# Patient Record
Sex: Female | Born: 2000 | Race: Black or African American | Hispanic: No | Marital: Single
Health system: Midwestern US, Community
[De-identification: ages and names within clinical notes are randomized; demographics above are authoritative.]

## PROBLEM LIST (undated history)

## (undated) DIAGNOSIS — F32A Depression, unspecified: Secondary | ICD-10-CM

## (undated) DIAGNOSIS — J45909 Unspecified asthma, uncomplicated: Secondary | ICD-10-CM

## (undated) DIAGNOSIS — F329 Major depressive disorder, single episode, unspecified: Secondary | ICD-10-CM

## (undated) HISTORY — PX: TONSILLECTOMY: SUR1361

## (undated) NOTE — ED Triage Notes (Signed)
 Formatting of this note might be different from the original. Patient reports last menstrual cycle 15 weeks ago. Reports vaginal bleeding and abdominal pain started yesterday. Reports bleeding as heavy, bright red and clots. Denies at home pregnancy test.  Electronically signed by Glendia Birmingham, RN at 09/09/2019  5:23 PM EDT

## (undated) NOTE — ED Provider Notes (Signed)
 Formatting of this note is different from the original. EMERGENCY DEPARTMENT HISTORY AND PHYSICAL EXAM  Date: 09/09/2019 Patient Name: Tamara Cummings  History of Presenting Illness   Chief Complaint  Patient presents with  ? Vaginal Bleeding   History Provided By: Patient  Chief Complaint: missed period, abd pain and vaginal bleeding  Duration: 15 weeks-LMP, vaginal bleeding x 2 days Timing: acute  Location: pelvic  Quality: crampy, heavy bleeding  Severity:moderate Modifying Factors: none Associated Symptoms: abd cramping   Additional History (Context): Tamara Cummings is a 54 y.o. female with PMH asthma, depression, high risk sexual behavior,  who presents with c/o heavy vaginal bleeding and abd cramping x 2 days after not having a menstrual cycle for approximately 15 weeks. Pt denies taking a home pregnancy test at any point in time during the past 15 weeks. She also has an extensive hx of recurrent pelvic infections to include PID, GC, chalmydia, and tubo-ovarian abscess, all related to high risk sexual activity.  Patient states she is currently only sexually active with her boyfriend who receives regular STD testing every month.  She denies any concerns for STD exposure at this time but does report some intermittent pelvic discomfort and vaginal irritation over the past several weeks.  Denies vaginal discharge at this time.  No other complaints are reported at this time.  PCP: None  Current Outpatient Medications  Medication Sig Dispense Refill  ? metroNIDAZOLE  (FlagyL ) 500 mg tablet Take 1 Tablet by mouth two (2) times a day for 7 days. 14 Tablet 0  ? ferrous sulfate (IRON) 325 mg (65 mg iron) EC tablet Take 1 Tablet by mouth two (2) times a day. 28 Tablet 0  ? ibuprofen  (MOTRIN ) 400 mg tablet Take 1 Tab by mouth every six (6) hours as needed for Pain. Indications: pain 30 Tab 0  ? ARIPiprazole (Abilify) 10 mg tablet Take 1 Tab by mouth daily. 30 Tab 1  ? escitalopram  oxalate  (LEXAPRO ) 10 mg tablet Take 1 Tab by mouth daily. 30 Tab 1  ? ARIPiprazole (Abilify) 10 mg tablet Take 10 mg by mouth daily.    ? escitalopram  oxalate (Lexapro ) 10 mg tablet Take 10 mg by mouth daily.     Past History   Past Medical History: Past Medical History:  Diagnosis Date  ? Asthma   ? Problems related to high-risk sexual behavior 06/06/2018  ? TOA (tubo-ovarian abscess) 06/06/2018   Past Surgical History: Past Surgical History:  Procedure Laterality Date  ? HX TONSILLECTOMY     Family History: No family history on file.  Social History: Social History   Tobacco Use  ? Smoking status: Never Smoker  ? Smokeless tobacco: Never Used  Substance Use Topics  ? Alcohol use: Never  ? Drug use: Yes    Types: Marijuana   Allergies: No Known Allergies  Review of Systems  Review of Systems  Constitutional: Negative.  Negative for chills and fever.  HENT: Negative.  Negative for congestion, ear pain and rhinorrhea.   Eyes: Negative.  Negative for pain and redness.  Respiratory: Negative.  Negative for cough, shortness of breath, wheezing and stridor.   Cardiovascular: Negative.  Negative for chest pain and leg swelling.  Gastrointestinal: Positive for abdominal pain. Negative for constipation, diarrhea, nausea and vomiting.  Genitourinary: Positive for menstrual problem, pelvic pain and vaginal bleeding. Negative for dysuria and frequency.  Musculoskeletal: Negative.  Negative for back pain and neck pain.  Skin: Negative.  Negative for rash and wound.  Neurological: Negative.  Negative for dizziness, seizures, syncope and headaches.  All other systems reviewed and are negative.  All Other Systems Negative Physical Exam   Vitals:   09/09/19 1721  BP: 129/91  Pulse: 87  Resp: 18  Temp: 98.2 F (36.8 C)  SpO2: 100%  Weight: 59 kg  Height: 160 cm   Physical Exam Vitals and nursing note reviewed.  Constitutional:      General: She is not in acute distress.     Appearance: She is well-developed. She is not diaphoretic.  HENT:     Head: Normocephalic and atraumatic.  Eyes:     General: No scleral icterus.       Right eye: No discharge.        Left eye: No discharge.     Conjunctiva/sclera: Conjunctivae normal.  Cardiovascular:     Rate and Rhythm: Normal rate and regular rhythm.     Heart sounds: Normal heart sounds. No murmur heard.  No friction rub. No gallop.   Pulmonary:     Effort: Pulmonary effort is normal. No respiratory distress.     Breath sounds: Normal breath sounds. No stridor. No wheezing or rales.  Abdominal:     General: Bowel sounds are normal. There is no distension.     Palpations: Abdomen is soft.     Tenderness: There is abdominal tenderness. There is no guarding.     Comments: Diffuse lower abd TTP noted on exam, no guarding noted, no evidence of acute abdomen   Genitourinary:    Comments: Completed self swabs at bedside  Musculoskeletal:        General: Normal range of motion.     Cervical back: Normal range of motion and neck supple.  Skin:    General: Skin is warm and dry.     Findings: No erythema or rash.  Neurological:     Mental Status: She is alert and oriented to person, place, and time.     Coordination: Coordination normal.     Comments: Gait is steady and patient exhibits no evidence of ataxia. Patient is able to ambulate without difficulty. No focal neurological deficit noted. No facial droop, slurred speech, or evidence of altered mentation noted on exam.    Psychiatric:        Behavior: Behavior normal.        Thought Content: Thought content normal.     Diagnostic Study Results   Labs -  Recent Results (from the past 12 hour(s))  HCG URINE, QL   Collection Time: 09/09/19  5:24 PM  Result Value Ref Range   HCG urine, QL Negative NEG    URINALYSIS W/ RFLX MICROSCOPIC   Collection Time: 09/09/19  5:24 PM  Result Value Ref Range   Color DARK YELLOW     Appearance CLOUDY     Specific  gravity >1.030 (H) 1.005 - 1.030   pH (UA) 5.5 5.0 - 8.0     Protein 100 (A) NEG mg/dL   Glucose Negative NEG mg/dL   Ketone TRACE (A) NEG mg/dL   Bilirubin Negative NEG     Blood LARGE (A) NEG     Urobilinogen 1.0 0.2 - 1.0 EU/dL   Nitrites Negative NEG     Leukocyte Esterase Negative NEG    URINE MICROSCOPIC ONLY   Collection Time: 09/09/19  5:24 PM  Result Value Ref Range   WBC 0 to 3 0 - 4 /hpf   RBC TOO NUMEROUS TO COUNT 0 - 5 /  hpf   Epithelial cells 2+ 0 - 5 /lpf   Bacteria 1+ (A) NEG /hpf   Mucus 1+ (A) NEG /lpf  METABOLIC PANEL, COMPREHENSIVE   Collection Time: 09/09/19  5:45 PM  Result Value Ref Range   Sodium 138 136 - 145 mmol/L   Potassium 4.8 3.5 - 5.5 mmol/L   Chloride 109 100 - 111 mmol/L   CO2 25 21 - 32 mmol/L   Anion gap 4 3.0 - 18 mmol/L   Glucose 83 74 - 99 mg/dL   BUN 13 7.0 - 18 MG/DL   Creatinine 9.36 0.6 - 1.3 MG/DL   BUN/Creatinine ratio 21 (H) 12 - 20     GFR est AA Cannot be calculated >60 ml/min/1.79m2   GFR est non-AA Cannot be calculated >60 ml/min/1.29m2   Calcium 8.6 8.5 - 10.1 MG/DL   Bilirubin, total 0.3 0.2 - 1.0 MG/DL   ALT (SGPT) 21 13 - 56 U/L   AST (SGOT) 64 (H) 10 - 38 U/L   Alk. phosphatase 72 45 - 117 U/L   Protein, total 8.5 (H) 6.4 - 8.2 g/dL   Albumin 3.9 3.4 - 5.0 g/dL   Globulin 4.6 (H) 2.0 - 4.0 g/dL   A-G Ratio 0.8 0.8 - 1.7    BETA HCG, QT   Collection Time: 09/09/19  5:45 PM  Result Value Ref Range   Beta HCG, QT <1 0 - 10 MIU/ML  WET PREP   Collection Time: 09/09/19  7:07 PM   Specimen: Vagina  Result Value Ref Range   Special Requests: NO SPECIAL REQUESTS     Wet prep FEW CLUE CELLS PRESENT   Wet prep NO YEAST SEEN     Wet prep NO TRICHOMONAS SEEN    CBC W/O DIFF   Collection Time: 09/09/19  7:34 PM  Result Value Ref Range   WBC 5.6 4.6 - 13.2 K/uL   RBC 3.98 (L) 4.00 - 5.20 M/uL   HGB 8.6 (L) 11.5 - 15.0 g/dL   HCT 71.3 (L) 64.9 - 54.9 %   MCV 71.9 (L) 77.0 - 95.0 FL   MCH 21.6 (L) 25.0 - 33.0 PG    MCHC 30.1 (L) 31.0 - 37.0 g/dL   RDW 80.9 (H) 88.3 - 85.4 %   PLATELET 345 135 - 420 K/uL   MPV 10.2 9.2 - 11.8 FL  DIFFERENTIAL, AUTO   Collection Time: 09/09/19  7:34 PM  Result Value Ref Range   NEUTROPHILS 47 40 - 73 %   LYMPHOCYTES 38 21 - 52 %   MONOCYTES 12 (H) 3 - 10 %   EOSINOPHILS 2 0 - 5 %   BASOPHILS 1 0 - 2 %   ABS. NEUTROPHILS 2.6 1.8 - 8.0 K/UL   ABS. LYMPHOCYTES 2.1 0.9 - 3.6 K/UL   ABS. MONOCYTES 0.7 0.05 - 1.2 K/UL   ABS. EOSINOPHILS 0.1 0.0 - 0.4 K/UL   ABS. BASOPHILS 0.0 0.0 - 0.1 K/UL   DF AUTOMATED     Radiologic Studies -  US  TRANSVAGINAL W DOPPLER  Final Result  1.  No acute pathology appreciated in the uterus or adnexa    CT Results  (Last 48 hours)   None    CXR Results  (Last 48 hours)   None    Medical Decision Making  I am the first provider for this patient.  I reviewed the vital signs, available nursing notes, past medical history, past surgical history, family history and social history.  Vital  Signs-Reviewed the patient's vital signs.  Records Reviewed:  April J Harvill, PA-C   Procedures: Procedures  Provider Notes (Medical Decision Making): Impression: DUB, pelvic pain  IV inserted   UA large blood, trace ketones, 100 protein, 1+ mucus, 1+ bacteria, negative leukocyte esterase, hcg negative  Beta hcg < 1 CMP unremarkable, CBC: hgb 8.6, hct 28.6, normal WBC, wet prep shows BV US  negative for torsion or TOA, no acute process noted  Discussed these results with the pt. Will plan to d.c with flagyl  and ferrous sulfate, pcp and OBGYN follow-up recommended. Pt agrees. April J Harvill, PA-C   MED RECONCILIATION: No current facility-administered medications for this encounter.   Current Outpatient Medications  Medication Sig  ? metroNIDAZOLE  (FlagyL ) 500 mg tablet Take 1 Tablet by mouth two (2) times a day for 7 days.  ? ferrous sulfate (IRON) 325 mg (65 mg iron) EC tablet Take 1 Tablet by mouth two (2) times a day.  ?  ibuprofen  (MOTRIN ) 400 mg tablet Take 1 Tab by mouth every six (6) hours as needed for Pain. Indications: pain  ? ARIPiprazole (Abilify) 10 mg tablet Take 1 Tab by mouth daily.  ? escitalopram  oxalate (LEXAPRO ) 10 mg tablet Take 1 Tab by mouth daily.  ? ARIPiprazole (Abilify) 10 mg tablet Take 10 mg by mouth daily.  ? escitalopram  oxalate (Lexapro ) 10 mg tablet Take 10 mg by mouth daily.   Disposition: D/c  DISCHARGE NOTE:  Patient is stable for discharge at this time. I have discussed all the findings from today's work up with the patient, including lab results and imaging. I have answered all questions. Rx for flagyl  and ferrous sulfate given. Rest and close follow-up with the PCP recommended this week. Return to the ED immediately for any new or worsening symptoms.  April JINNY Golds, PA-C   Follow-up Information    Follow up With Specialties Details Why Contact Info   [facility], [facility] Obstetrics & Gynecology In 1 week  [address] [place]  [address] [phone]   [facility] EMERGENCY DEPT Emergency Medicine  As needed, If symptoms worsen [address] [place]  [id number] [phone]    Current Discharge Medication List   START taking these medications   Details  metroNIDAZOLE  (FlagyL ) 500 mg tablet Take 1 Tablet by mouth two (2) times a day for 7 days. Qty: 14 Tablet, Refills: 0 Start date: 09/09/2019, End date: 09/16/2019   ferrous sulfate (IRON) 325 mg (65 mg iron) EC tablet Take 1 Tablet by mouth two (2) times a day. Qty: 28 Tablet, Refills: 0 Start date: 09/09/2019     Diagnosis   Clinical Impression:  1. Pelvic pain   2. DUB (dysfunctional uterine bleeding)   3. Abnormal menses   4. Anemia, unspecified type   5. BV (bacterial vaginosis)     Electronically signed by Orlena Toribio FERNS, MD at 09/15/2019 10:14 AM EDT  Associated attestation - Orlena Toribio FERNS, MD - 09/15/2019 10:14 AM EDT Formatting of this note might be  different from the original. I was personally available for consultation in the emergency department.  I have reviewed the chart prior to the patient being discharged and agree with the documentation recorded by the [facility], including the assessment, treatment plan, and disposition. Toribio FERNS Orlena, MD

## (undated) NOTE — ED Notes (Signed)
 Formatting of this note might be different from the original. 10:51 PM 09/09/19  Discharge instructions given to patient (name) with verbalization of understanding. Patient accompanied by self.  Patient discharged with the following prescriptions Flagyl . Patient discharged to home (destination).   Josette EMERSON Manifold, RN  Electronically signed by Manifold Josette HERO., RN at 09/09/2019 10:52 PM EDT

## (undated) NOTE — Progress Notes (Signed)
 Formatting of this note might be different from the original. Not treated empirically.  Needs a call back Electronically signed by Cloretta Onalee LABOR, MD at 09/13/2019  4:21 AM EDT

---
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
## 2021-04-15 ENCOUNTER — Inpatient Hospital Stay
Admission: EM | Admit: 2021-04-15 | Discharge: 2021-04-18 | Disposition: A | Discharge status: Home / Self Care | Payer: MEDICAID | Admitting: Psychiatry

## 2021-04-15 DIAGNOSIS — T1491XA Suicide attempt, initial encounter: Secondary | ICD-10-CM

## 2021-04-15 DIAGNOSIS — F333 Major depressive disorder, recurrent, severe with psychotic symptoms: Secondary | ICD-10-CM

## 2021-04-15 LAB — COMPREHENSIVE METABOLIC PANEL
ALT: 19 U/L (ref 13–56)
AST: 25 U/L (ref 10–38)
Albumin/Globulin Ratio: 1 (ref 0.8–1.7)
Albumin: 4.1 g/dL (ref 3.4–5.0)
Alk Phosphatase: 67 U/L (ref 45–117)
Anion Gap: 5 mmol/L (ref 3.0–18)
BUN: 11 MG/DL (ref 7.0–18)
Bun/Cre Ratio: 16 (ref 12–20)
CO2: 25 mmol/L (ref 21–32)
Calcium: 9.4 MG/DL (ref 8.5–10.1)
Chloride: 106 mmol/L (ref 100–111)
Creatinine: 0.68 MG/DL (ref 0.6–1.3)
Est, Glom Filt Rate: >60 mL/min/{1.73_m2} (ref >60)
Globulin: 4.3 g/dL — ABNORMAL HIGH (ref 2.0–4.0)
Glucose: 70 mg/dL — ABNORMAL LOW (ref 74–99)
Potassium: 3.7 mmol/L (ref 3.5–5.5)
Sodium: 136 mmol/L (ref 136–145)
Total Bilirubin: 0.6 MG/DL (ref 0.2–1.0)
Total Protein: 8.4 g/dL — ABNORMAL HIGH (ref 6.4–8.2)

## 2021-04-15 LAB — URINE DRUG SCREEN
Amphetamine, Urine: NEGATIVE
Barbiturates, Urine: NEGATIVE
Benzodiazepines, Urine: NEGATIVE
Cocaine, Urine: NEGATIVE
Methadone, Urine: NEGATIVE
Opiates, Urine: NEGATIVE
PCP, Urine: NEGATIVE
THC, TH-Cannabinol, Urine: POSITIVE — AB

## 2021-04-15 LAB — CBC WITH AUTO DIFFERENTIAL
Absolute Immature Granulocyte: 0 10*3/uL (ref 0.00–0.04)
Basophils %: 1 % (ref 0–2)
Basophils Absolute: 0 10*3/uL (ref 0.0–0.1)
Eosinophils %: 8 % — ABNORMAL HIGH (ref 0–5)
Eosinophils Absolute: 0.3 10*3/uL (ref 0.0–0.4)
Hematocrit: 34.2 % — ABNORMAL LOW (ref 35.0–45.0)
Hemoglobin: 11.2 g/dL — ABNORMAL LOW (ref 12.0–16.0)
Immature Granulocytes: 0 % (ref 0.0–0.5)
Lymphocytes %: 39 % (ref 21–52)
Lymphocytes Absolute: 1.5 10*3/uL (ref 0.9–3.6)
MCH: 27.1 PG (ref 24.0–34.0)
MCHC: 32.7 g/dL (ref 31.0–37.0)
MCV: 82.8 FL (ref 78.0–100.0)
MPV: 12 FL — ABNORMAL HIGH (ref 9.2–11.8)
Monocytes %: 11 % — ABNORMAL HIGH (ref 3–10)
Monocytes Absolute: 0.4 10*3/uL (ref 0.05–1.2)
Neutrophils %: 41 % (ref 40–73)
Neutrophils Absolute: 1.6 10*3/uL — ABNORMAL LOW (ref 1.8–8.0)
Nucleated RBCs: 0 PER 100 WBC
Platelets: 246 10*3/uL (ref 135–420)
RBC: 4.13 M/uL — ABNORMAL LOW (ref 4.20–5.30)
RDW: 16.9 % — ABNORMAL HIGH (ref 11.6–14.5)
WBC: 3.9 10*3/uL — ABNORMAL LOW (ref 4.6–13.2)
nRBC: 0 10*3/uL (ref 0.00–0.01)

## 2021-04-15 LAB — ACETAMINOPHEN LEVEL: Acetaminophen Level: <2 ug/mL — ABNORMAL LOW (ref 10.0–30.0)

## 2021-04-15 LAB — ETHANOL: Ethanol Lvl: <3 MG/DL (ref 0–3)

## 2021-04-15 LAB — COVID-19 & INFLUENZA COMBO
Rapid Influenza A By PCR: NOT DETECTED
Rapid Influenza B By PCR: NOT DETECTED
SARS-CoV-2, PCR: NOT DETECTED

## 2021-04-15 LAB — SALICYLATE LEVEL
Salicylate, Serum: 1.9 MG/DL — ABNORMAL LOW (ref 2.8–20.0)
Salicylate, Serum: <1.7 MG/DL — ABNORMAL LOW (ref 2.8–20.0)

## 2021-04-15 LAB — HCG, SERUM, QUALITATIVE: HCG, Ql.: NEGATIVE

## 2021-04-15 MED ORDER — ONDANSETRON 8 MG PO TBDP
8 MG | ORAL | Status: AC
Start: 2021-04-15 — End: 2021-04-15
  Administered 2021-04-15: 17:00:00 8 mg via ORAL

## 2021-04-15 MED ORDER — LIDOCAINE VISCOUS HCL 2 % MT SOLN
2 % | Freq: Once | OROMUCOSAL | Status: AC
Start: 2021-04-15 — End: 2021-04-15
  Administered 2021-04-15: 17:00:00 40 mL via ORAL

## 2021-04-15 MED FILL — ONDANSETRON 8 MG PO TBDP: 8 MG | ORAL | Qty: 1

## 2021-04-15 MED FILL — MAG-AL PLUS 200-200-20 MG/5ML PO LIQD: 200-200-20 MG/5ML | ORAL | Qty: 30

## 2021-04-15 NOTE — ED Notes (Signed)
SBAR Report called to [place], RN at ext [phone]. All questions answered. Pt to be transported to IP unit and is aware.      Edmonia James, RN  04/15/21 [phone]

## 2021-04-15 NOTE — ED Notes (Addendum)
Pt checked and changed to a paper gown. Security checked done,pt's belongings placed on locker 3A.     Gwenevere Abbot, RN  04/15/21 [address] [place], RN  04/15/21 1319

## 2021-04-15 NOTE — ED Notes (Signed)
Spoke with Marcelino Duster from poison control to update information regarding pt.      Edmonia James, RN  04/15/21 2101

## 2021-04-15 NOTE — ED Notes (Addendum)
Noted pt's BS in labs 70, pt was given lunch tray.     Gwenevere Abbot, RN  04/15/21 1505       Ladora Daniel [place], RN  04/15/21 1616

## 2021-04-15 NOTE — ED Provider Notes (Signed)
[facility] EMERGENCY DEPT  EMERGENCY DEPARTMENT ENCOUNTER      Pt Name: Jillian Larsen  MRN: [id number]  Birthdate October 27, 2000  Date of evaluation: 04/15/2021  Provider: Arvella Merles, PA    CHIEF COMPLAINT       Chief Complaint   Patient presents with    Suicide Attempt         HISTORY OF PRESENT ILLNESS   (Location/Symptom, Timing/Onset, Context/Setting, Quality, Duration, Modifying Factors, Severity)  Note limiting factors.   Jillian Larsen is a 21 y.o. female who presents to the emergency department complaint of suicide attempt by drinking brake power steering fluid and some sort of car cleaning fluid today she sprayed that in her mouth.  Her boyfriend was resting away from her.  She said what she swallowed she also spit right back up.  Does not think she drink more than a mouthful.  Feels depressed.  She is to take medications, like Abilify, hydroxyzine, and Lexapro when she was in [place] but now that she is up here since moving from [place] she does not have [place] and is no longer eligible for meds.  Denies possibility of pregnancy.  Her throat is a little sore and a little nauseous but denies any difficulty swallowing or abdominal pain.  Denies tobacco alcohol or illicits.    HPI    Nursing Notes were reviewed.    REVIEW OF SYSTEMS    (2-9 systems for level 4, 10 or more for level 5)     Review of Systems   Constitutional: Negative.    HENT:  Positive for sore throat. Negative for trouble swallowing.    Eyes: Negative.    Respiratory: Negative.     Cardiovascular: Negative.    Gastrointestinal:  Positive for nausea. Negative for abdominal pain.   Endocrine: Negative.    Genitourinary: Negative.    Musculoskeletal: Negative.    Skin: Negative.    Allergic/Immunologic: Negative.    Neurological: Negative.    Hematological: Negative.    Psychiatric/Behavioral:  Positive for dysphoric mood and suicidal ideas.      Except as noted above the remainder of the review of systems was reviewed and  negative.       PAST MEDICAL HISTORY     Past Medical History:   Diagnosis Date    Asthma     Problems related to high-risk sexual behavior 06/06/2018    TOA (tubo-ovarian abscess) 06/06/2018         SURGICAL HISTORY       Past Surgical History:   Procedure Laterality Date    TONSILLECTOMY           CURRENT MEDICATIONS       Previous Medications    ARIPIPRAZOLE (ABILIFY) 10 MG TABLET    Take 10 mg by mouth daily    ESCITALOPRAM (LEXAPRO) 10 MG TABLET    Take 10 mg by mouth daily    FERROUS SULFATE (FE TABS 325) 325 (65 FE) MG EC TABLET    Take 325 mg by mouth 2 times daily    IBUPROFEN (ADVIL;MOTRIN) 400 MG TABLET    Take 400 mg by mouth every 6 hours as needed       ALLERGIES     Peanut-containing drug products    FAMILY HISTORY     No family history on file.       SOCIAL HISTORY       Social History     Socioeconomic History    Marital status: Single  Tobacco Use    Smoking status: Never    Smokeless tobacco: Never   Substance and Sexual Activity    Alcohol use: Never    Drug use: Yes     Types: Marijuana (Weed)       SCREENINGS         Glasgow Coma Scale  Eye Opening: Spontaneous  Best Verbal Response: Oriented  Best Motor Response: Obeys commands  Glasgow Coma Scale Score: 15                     CIWA Assessment  BP: 114/67  Heart Rate: 81                 PHYSICAL EXAM    (up to 7 for level 4, 8 or more for level 5)     ED Triage Vitals [04/15/21 1232]   BP Temp Temp Source Heart Rate Resp SpO2 Height Weight   114/67 98.4 F (36.9 C) Oral 81 16 95 % 5\' 3"  (1.6 m) 134 lb (60.8 kg)       Physical Exam  Constitutional:       General: She is not in acute distress.     Appearance: Normal appearance. She is normal weight. She is not toxic-appearing.   HENT:      Head: Normocephalic.      Nose: Nose normal.      Mouth/Throat:      Mouth: Mucous membranes are moist.   Eyes:      Extraocular Movements: Extraocular movements intact.   Cardiovascular:      Rate and Rhythm: Normal rate and regular rhythm.      Pulses: Normal  pulses.      Heart sounds: Normal heart sounds.   Pulmonary:      Effort: Pulmonary effort is normal.      Breath sounds: Normal breath sounds.   Abdominal:      General: Abdomen is flat.      Tenderness: There is no abdominal tenderness.   Musculoskeletal:         General: No deformity.      Cervical back: Normal range of motion and neck supple. No rigidity.   Skin:     General: Skin is warm.   Neurological:      Mental Status: She is alert and oriented to person, place, and time.   Psychiatric:         Mood and Affect: Mood normal.         Behavior: Behavior normal.         Thought Content: Thought content normal.         Judgment: Judgment normal.       DIAGNOSTIC RESULTS     EKG: All EKG's are interpreted by the Emergency Department Physician who either signs or Co-signs this chart in the absence of a cardiologist.        RADIOLOGY:   Non-plain film images such as CT, Ultrasound and MRI are read by the radiologist. Plain radiographic images are visualized and preliminarily interpreted by the emergency physician with the below findings:        Interpretation per the Radiologist below, if available at the time of this note:    No orders to display         ED BEDSIDE ULTRASOUND:   Performed by ED Physician - none    LABS:  Labs Reviewed   COVID-19 & INFLUENZA COMBO   CBC WITH AUTO DIFFERENTIAL  COMPREHENSIVE METABOLIC PANEL   ETHANOL   HCG, SERUM, QUALITATIVE   URINE DRUG SCREEN       All other labs were within normal range or not returned as of this dictation.    EMERGENCY DEPARTMENT COURSE and DIFFERENTIAL DIAGNOSIS/MDM:   Vitals:    Vitals:    04/15/21 1232   BP: 114/67   Pulse: 81   Resp: 16   Temp: 98.4 F (36.9 C)   TempSrc: Oral   SpO2: 95%   Weight: 134 lb (60.8 kg)   Height: 5\' 3"  (1.6 m)           Medical Decision Making  Amount and/or Complexity of Data Reviewed  Labs: ordered.  ECG/medicine tests: ordered.    Risk  Prescription drug management.  Decision regarding hospitalization.      Suicide attempt  by ingestion.  No longer has access to psychiatric care.  Medically stable evaluated by crisis excepted here.      REASSESSMENT          CRITICAL CARE TIME   Total Critical Care time was 35 minutes, excluding separately reportable procedures.  There was a high probability of clinically significant/life threatening deterioration in the patient's condition which required my urgent intervention.  Critical care time exclusive of any billable procedures.    CONSULTS:  None    PROCEDURES:  Unless otherwise noted below, none     Procedures      FINAL IMPRESSION    No diagnosis found.      DISPOSITION/PLAN   DISPOSITION        PATIENT REFERRED TO:  No follow-up provider specified.    DISCHARGE MEDICATIONS:  New Prescriptions    No medications on file     Controlled Substances Monitoring:     No flowsheet data found.    (Please note that portions of this note were completed with a voice recognition program.  Efforts were made to edit the dictations but occasionally words are mis-transcribed.)    Arvella MerlesJana Gaylo, PA (electronically signed)  Attending Emergency Physician           Karen KitchensJana L Estephani Popper, PA  04/15/21 [phone]

## 2021-04-15 NOTE — ED Notes (Signed)
Called poison control, per Marcelino Duster she recommends EKG, ASA and Tylenol levels.     Gwenevere Abbot, RN  04/15/21 1358

## 2021-04-15 NOTE — Progress Notes (Signed)
Pt. arrived from ED alert and oriented x 4 escorted by Security and ED staff via wheelchair. Pt. Is admitted for suicidal attempt, she tried to drink a brake fluid and car cleaning fluid under voluntary admission. She has a previous Psychiatry in patient admission in [place]. She is currently not taking any medications .She is depressed, flat affect with sad mood but pleasant and cooperative to admission process.   RN WILL INITIATE, DEVELOP, IMPLEMENT, REVIEW OR REVISE TREATMENT PLAN.

## 2021-04-15 NOTE — ED Notes (Signed)
Report given to Taheesha,RN. All questions answered.     Courtlyn Aki [place] Crandall [place], RN  04/15/21 1917

## 2021-04-15 NOTE — Other (Signed)
[facility] Note: Responded to Dr. Barbaraann Cao consult for mental health problem. Crisis counselor to assess as soon as possible pending, an attending being assigned, medical clearance, UDS results, and a clinically sober EtOH level < 150 mg/dl.

## 2021-04-15 NOTE — ED Triage Notes (Signed)
Pt arrived via EMS c/o SI per report pt attempted to drink power steering liquid and armol oil, unsure amount per pt she vomited right away wioth yellow liquid. Pt c/o abdominal pain on ED arrival. Hx of Depression.

## 2021-04-15 NOTE — Other (Signed)
Crisis Note: Patient has been accepted for inpatient treatment at [facility] by Dr. Freddy Finner. Crisis gave report to RN Joletta. Patient will go into room Room 124-1. Discussed with NSS Beth about bed assignment. Charge nurse and ED physician made aware.
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
## 2021-04-15 NOTE — ED Notes (Signed)
Report given to Taheesha,RN. All questions answered.     Eve Dohse, RN  04/15/21 1915

## 2021-04-15 NOTE — ED Notes (Signed)
Report received from [place], RN     [employer], [place]  04/15/21 2049

## 2021-04-16 LAB — EKG 12-LEAD
Atrial Rate: 73 {beats}/min
Diagnosis: NORMAL
P Axis: 71 degrees
P-R Interval: 144 ms
Q-T Interval: 378 ms
QRS Duration: 70 ms
QTc Calculation (Bazett): 416 ms
R Axis: 66 degrees
T Axis: 59 degrees
Ventricular Rate: 73 {beats}/min

## 2021-04-16 MED ORDER — TRAZODONE HCL 50 MG PO TABS
50 MG | Freq: Every evening | ORAL | Status: DC | PRN
Start: 2021-04-16 — End: 2021-04-16
  Administered 2021-04-16: 03:00:00 50 mg via ORAL

## 2021-04-16 MED ORDER — HYDROXYZINE PAMOATE 50 MG PO CAPS
50 MG | Freq: Every evening | ORAL | Status: DC
Start: 2021-04-16 — End: 2021-04-18
  Administered 2021-04-18: 50 mg via ORAL

## 2021-04-16 MED ORDER — ALBUTEROL SULFATE HFA 108 (90 BASE) MCG/ACT IN AERS
10890 (90 Base) MCG/ACT | Freq: Four times a day (QID) | RESPIRATORY_TRACT | Status: DC | PRN
Start: 2021-04-16 — End: 2021-04-18

## 2021-04-16 MED ORDER — ACETAMINOPHEN 325 MG PO TABS
325 | ORAL | Status: DC | PRN
Start: 2021-04-16 — End: 2021-04-18

## 2021-04-16 MED ORDER — FERROUS SULFATE 325 (65 FE) MG PO TABS
32565 (65 Fe) MG | Freq: Every day | ORAL | Status: DC
Start: 2021-04-16 — End: 2021-04-17

## 2021-04-16 MED ORDER — ESCITALOPRAM OXALATE 10 MG PO TABS
10 MG | Freq: Every day | ORAL | Status: DC
Start: 2021-04-16 — End: 2021-04-18
  Administered 2021-04-16 – 2021-04-18 (×3): 10 mg via ORAL

## 2021-04-16 MED ORDER — POLYETHYLENE GLYCOL 3350 17 G PO PACK
17 g | Freq: Every day | ORAL | Status: DC | PRN
Start: 2021-04-16 — End: 2021-04-18

## 2021-04-16 MED ORDER — NICOTINE POLACRILEX 4 MG MT LOZG
4 MG | OROMUCOSAL | Status: DC | PRN
Start: 2021-04-16 — End: 2021-04-18

## 2021-04-16 MED ORDER — EPINEPHRINE (ANAPHYLAXIS) 1 MG/ML IJ SOLN
1 MG/ML | INTRAMUSCULAR | Status: DC | PRN
Start: 2021-04-16 — End: 2021-04-18

## 2021-04-16 MED ORDER — ARIPIPRAZOLE 5 MG PO TABS
5 MG | Freq: Every day | ORAL | Status: DC
Start: 2021-04-16 — End: 2021-04-18
  Administered 2021-04-16 – 2021-04-18 (×3): 5 mg via ORAL

## 2021-04-16 MED ORDER — HYDROXYZINE HCL 50 MG PO TABS
50 MG | Freq: Three times a day (TID) | ORAL | Status: DC | PRN
Start: 2021-04-16 — End: 2021-04-18
  Administered 2021-04-16: 03:00:00 50 mg via ORAL

## 2021-04-16 MED FILL — ARIPIPRAZOLE 5 MG PO TABS: 5 MG | ORAL | Qty: 1

## 2021-04-16 MED FILL — HYDROXYZINE HCL 50 MG PO TABS: 50 MG | ORAL | Qty: 1

## 2021-04-16 MED FILL — TRAZODONE HCL 50 MG PO TABS: 50 MG | ORAL | Qty: 1

## 2021-04-16 MED FILL — ESCITALOPRAM OXALATE 10 MG PO TABS: 10 MG | ORAL | Qty: 1

## 2021-04-16 MED FILL — ALBUTEROL SULFATE HFA 108 (90 BASE) MCG/ACT IN AERS: 108 (90 Base) MCG/ACT | RESPIRATORY_TRACT | Qty: 1.2

## 2021-04-16 NOTE — Progress Notes (Signed)
Received patient In bed no acute distress, no  unsafe behaviors noted at this time. Patient was seen by [facility] medicine this morning.  Staff to continue to monitor for location, safety, and comfort.
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
## 2021-04-16 NOTE — Progress Notes (Signed)
Brief Progress Note    In addition to inhaler/epi pen started qd PO iron for iron deficiency anemia. Would recommend continuation on discharge. Will be available as needed.     Cathie Hoops, MD PGY-2  [facility]  April 16, 2021 1:59 PM

## 2021-04-16 NOTE — Behavioral Health Treatment Team (Signed)
Pt appeared to have slept for 6.75 hours thus far.Will continue to monitor for safety and changes in behavior.

## 2021-04-16 NOTE — Plan of Care (Signed)
Problem: Safety - Adult  Goal: Free from fall injury  Flowsheets (Taken 04/16/2021 0219)  Free From Fall Injury:   Instruct family/caregiver on patient safety   Based on caregiver fall risk screen, instruct family/caregiver to ask for assistance with transferring infant if caregiver noted to have fall risk factors

## 2021-04-16 NOTE — Progress Notes (Signed)
Chaplain conducted a Spiritual Group for Jillian Larsen, who is a 20 y.o.,female. Patient's Primary Language is: [place].   According to the patient's EMR Religious Affiliation is: [place].       The Chaplain provided the following Interventions:  Spiritual Group a relationship of care and support with  9 to 11 members. The group talked about spirituality. What does that mean to you? Spirituality is recognizing and celebrating that we are all connected to each other by a power greater than all of [place].      Chaplain Leron Croak   Spiritual Care   [phone]

## 2021-04-16 NOTE — Behavioral Health Treatment Team (Signed)
M.H.T. Note: The above pt has moved from room 124-1 to room 126-3 a this time with all her belongings has been moved at this time.

## 2021-04-16 NOTE — Behavioral Health Treatment Team (Signed)
M.H.T. Note: The above pt refused group despite much encouragement.

## 2021-04-16 NOTE — Plan of Care (Signed)
Problem: Safety - Adult  Goal: Free from fall injury  04/16/2021 0843 by Louanna Raw, RN  Outcome: Progressing  04/16/2021 0219 by Randa Lynn, RN  Flowsheets (Taken 04/16/2021 0219)  Free From Fall Injury:   Instruct family/caregiver on patient safety   Based on caregiver fall risk screen, instruct family/caregiver to ask for assistance with transferring infant if caregiver noted to have fall risk factors   Patient alert and oriented, in no acute distress at this time. Patient was in room resting, woke patient up prior to breakfast being served so she could be seen by [facility] medicine. Patient denies auditory hallucinations, suicidal ideations, harm to self or others when asked.  Patient is in her room resting quietly. Will continue to monitor for location, safety and comfort.
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
## 2021-04-16 NOTE — Progress Notes (Signed)
Patient alert and oriented c/o feeling sick, nauseous, vomiting. Patient blood pressure was checked 123/87 pulse 58. Doctor on call was called and informed. No new orders given. Will continue to monitor for safety, comfort and location.
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
## 2021-04-17 MED FILL — FERROUS SULFATE 325 (65 FE) MG PO TABS: 325 (65 Fe) MG | ORAL | Qty: 1

## 2021-04-17 MED FILL — ESCITALOPRAM OXALATE 10 MG PO TABS: 10 MG | ORAL | Qty: 1

## 2021-04-17 MED FILL — HYDROXYZINE PAMOATE 50 MG PO CAPS: 50 MG | ORAL | Qty: 1

## 2021-04-17 MED FILL — ARIPIPRAZOLE 5 MG PO TABS: 5 MG | ORAL | Qty: 1

## 2021-04-17 NOTE — Progress Notes (Signed)
GROUP THERAPY PROGRESS NOTE      Jillian Larsen was present for medication group.    GROUP TIME: 15 minutes, Thursday 2pm    PERSONAL GOAL FOR PARTICIPATION: To be present for group, participate in discussion, and answer patient-directed questions.    GOAL ORIENTATION: Personal    THERAPEUTIC INTERVENTIONS REVIEWED AND DISCUSSED: The following topics were presented: Therapeutic Interventions: storage of medications, how to remember to refill medications and keep up with doctor appointments, relapse prevention, keeping a record of all medication including prescription and non-prescription drugs, and who to contact with medication questions. Patients were given time to ask questions regarding their current therapy.    IMPRESSION OF PARTICIPATION:    Patient attended session and was attentive and engaged.    Ivor Reining, RPH

## 2021-04-17 NOTE — Behavioral Health Treatment Team (Signed)
Patient appeared to have slept 7 hours.

## 2021-04-17 NOTE — Plan of Care (Addendum)
Problem: Safety - Adult  Goal: Free from fall injury  Outcome: Progressing     Problem: Self Harm/Suicidality  Goal: Will have no self-injury during hospital stay  Outcome: Progressing   Pt is alert and oriented x 4 ,denies si/hi,hallucinations pain and anxiety and depression. Pt told nurse earlier in shift ,"See how I'm smiling" Called for nurse at 2100 with co of vomiting . Nurse went to patient and offered ice chips. She stated she vomited yellow clear vomitus, reminded pt to let nurse see it and to get some rest , no vomiting since , pt sleeping.no meds given due to nausea.
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
## 2021-04-17 NOTE — Progress Notes (Signed)
Behavioral Health Progress Note    Admit Date: 04/15/2021  Hospital day 2    Vitals : No data found.  Labs:  No results found for this or any previous visit (from the past 24 hour(s)).  Meds:   Current Facility-Administered Medications   Medication Dose Route Frequency    albuterol sulfate HFA (PROVENTIL;VENTOLIN;PROAIR) 108 (90 Base) MCG/ACT inhaler 2 puff  2 puff Inhalation Q6H PRN    EPINEPHrine 1 MG/ML injection 0.3 mg  0.3 mg IntraMUSCular PRN    ferrous sulfate (IRON 325) tablet 325 mg  325 mg Oral Daily    hydrOXYzine pamoate (VISTARIL) capsule 50 mg  50 mg Oral Nightly    escitalopram (LEXAPRO) tablet 10 mg  10 mg Oral Daily    ARIPiprazole (ABILIFY) tablet 5 mg  5 mg Oral Daily    acetaminophen (TYLENOL) tablet 650 mg  650 mg Oral Q4H PRN    polyethylene glycol (GLYCOLAX) packet 17 g  17 g Oral Daily PRN    hydrOXYzine HCl (ATARAX) tablet 50 mg  50 mg Oral TID PRN    nicotine polacrilex (COMMIT) lozenge 4 mg  4 mg Oral Q2H PRN      Hospital Problems: Principal Problem:    Major depressive disorder, recurrent, severe with psychotic features (HCC)  Active Problems:    Borderline personality disorder (HCC)    Nicotine use disorder  Resolved Problems:    * No resolved hospital problems. *      Subjective:   Medication side effects: none  none    Mental Status Exam  Sensorium: alert  Orientation: person place time  Relations: cooperative  Eye Contact: poor  Appearance: shows no evidence of impairment  Thought Process: normal rate of thoughts, fair abstract reasoning/computation, and logical    Thought Content: no evidence of impairment   Suicidal: denies today   Homicidal: none   Mood: is anxious   Affect: stable  Memory: shows no evidence of impairment     Concentration: distractable  Abstraction: concrete  Insight: The patient shows little insight    OR Fair  Judgement: is psychologically impaired OR  Fair    Assessment/Plan:   stable    Continue close observation,    Nurses report that the patient was  complaining of nausea and she vomited 3 times.  She says that it seems that the iron pill had made her extremely nauseous.  She did have poor sleep but says she did not take the sleep medication at night.  She is to take the hydroxyzine tonight.  She feels better and says that she needs to do therapy when she gets out of the hospital.  We will need to give her a referral.  No other medication complaint wants to stay with medicines as is.  Continue supportive care and medication management.  She is doing better and she might be wanting discharge tomorrow evening.

## 2021-04-17 NOTE — Plan of Care (Signed)
Problem: Safety - Adult  Goal: Free from fall injury  Outcome: Progressing     Problem: Anxiety  Goal: Will report anxiety at manageable levels  Description: INTERVENTIONS:  1. Administer medication as ordered  2. Teach and rehearse alternative coping skills  3. Provide emotional support with 1:1 interaction with staff  Outcome: Progressing     Problem: Depression/Self Harm  Goal: Effect of psychiatric condition will be minimized and patient will be protected from self harm  Description: INTERVENTIONS:  1. Assess impact of patient's symptoms on level of functioning, self care needs and offer support as indicated  2. Assess patient/family knowledge of depression, impact on illness and need for teaching  3. Provide emotional support, presence and reassurance  4. Assess for possible suicidal thoughts or ideation. If patient expresses suicidal thoughts or statements do not leave alone, initiate Suicide Precautions, move to a room close to the nursing station and obtain sitter  5. Initiate consults as appropriate with Mental Health Professional, Spiritual Care, Psychosocial CNS, and consider a recommendation to the LIP for a Psychiatric Consultation  Outcome: Progressing     Problem: Confusion  Goal: Confusion, delirium, dementia, or psychosis is improved or at baseline  Description: INTERVENTIONS:  1. Assess for possible contributors to thought disturbance, including medications, impaired vision or hearing, underlying metabolic abnormalities, dehydration, psychiatric diagnoses, and notify attending LIP  2. Institute high risk fall precautions, as indicated  3. Provide frequent short contacts to provide reality reorientation, refocusing and direction  4. Decrease environmental stimuli, including noise as appropriate  5. Monitor and intervene to maintain adequate nutrition, hydration, elimination, sleep and activity  6. If unable to ensure safety without constant attention obtain sitter and review sitter guidelines with  assigned personnel  7. Initiate Psychosocial CNS and Spiritual Care consult, as indicated  Outcome: Progressing       Assumed care of pt at 0730.    Pt has been out in the milieu interacting with other patient appropriately.  Pt affect is brightened.  Pt did attend groups and eat meals out in dayroom.    Pt denies SI/HI/SH thoughts at this time and has contracted for safety.  Pt denies ALL hallucinations.       Pt is compliant with prescribed medications, no prn medications given.       No other concerns on shift, will monitor for safety and comfort.

## 2021-04-17 NOTE — Progress Notes (Signed)
Pt reports after taking medications yesterday and due to ongoing nausea she does not want to eat breakfast.  Will speak with provider for orders.

## 2021-04-17 NOTE — Behavioral Health Treatment Team (Signed)
Received patient in the milieu at change of shift. Interacting with select peers. No unsafe behaviors noted. No distress noted. Compliant with HS PO meds. Offered no c/o. Staff to continue to monitor for location, safety, and comfort.

## 2021-04-18 MED ORDER — ARIPIPRAZOLE 5 MG PO TABS
5 MG | ORAL_TABLET | Freq: Every day | ORAL | 0 refills | Status: AC
Start: 2021-04-18 — End: ?

## 2021-04-18 MED ORDER — ESCITALOPRAM OXALATE 10 MG PO TABS
10 MG | ORAL_TABLET | Freq: Every day | ORAL | 0 refills | Status: AC
Start: 2021-04-18 — End: ?

## 2021-04-18 MED ORDER — PROMETHAZINE HCL 12.5 MG PO TABS
12.5 MG | Freq: Once | ORAL | Status: AC
Start: 2021-04-18 — End: 2021-04-18
  Administered 2021-04-18: 13:00:00 25 mg via ORAL

## 2021-04-18 MED FILL — ESCITALOPRAM OXALATE 10 MG PO TABS: 10 MG | ORAL | Qty: 1

## 2021-04-18 MED FILL — PROMETHAZINE HCL 12.5 MG PO TABS: 12.5 MG | ORAL | Qty: 2

## 2021-04-18 MED FILL — ARIPIPRAZOLE 5 MG PO TABS: 5 MG | ORAL | Qty: 1
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
## 2021-04-18 NOTE — Progress Notes (Signed)
Pt c/o poor appetite due to nausea. MD gave orders for one-time dose of Phenergan 25 mg.

## 2021-04-18 NOTE — Group Note (Signed)
Group Therapy Note    Date: 04/18/2021    Group Start Time: 0800  Group End Time: 0830  Group Topic: Nursing    [facility] ADULT CHEM DEP    Virgia Land, RN    Spirituality-"Healing the heart through a relationship with Jesus"       Patient's Goal:  "Work on my coping skills"    Notes:  n/a    Status After Intervention:  Improved    Participation Level: Active Listener    Participation Quality: Appropriate      Speech:  normal      Thought Process/Content: Logical  Linear      Affective Functioning: Congruent      Mood: euphoric      Level of consciousness:  Alert      Response to Learning: Able to verbalize current knowledge/experience      Endings: None Reported    Modes of Intervention: Education      Discipline Responsible: Registered Nurse      Signature:  Virgia Land, RN
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
## 2021-04-18 NOTE — Progress Notes (Signed)
Pt received discharge instructions, prescriptions, and emergency numbers. Pt completed satisfaction survey. Pt completed suicide safety plan with staff. All personal belongings returned to pt. Pt encouraged to follow-up with outpatient resources and to call with any questions or concerns.      Pt. Is encouraged to follow-up with Intake at   [facility]  [address] [place], [place] [address]  Phone: [phone]
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
## 2021-04-18 NOTE — Behavioral Health Treatment Team (Signed)
Patient appeared to have slept 7 hours.  Will continue to monitor.

## 2023-10-05 ENCOUNTER — Other Ambulatory Visit: Payer: Self-pay

## 2023-10-05 ENCOUNTER — Emergency Department (HOSPITAL_COMMUNITY)
Admission: EM | Admit: 2023-10-05 | Discharge: 2023-10-06 | Disposition: A | Discharge status: Home / Self Care | Payer: Self-pay | Attending: Emergency Medicine | Admitting: Emergency Medicine

## 2023-10-05 DIAGNOSIS — Z91010 Allergy to peanuts: Secondary | ICD-10-CM | POA: Diagnosis not present

## 2023-10-05 DIAGNOSIS — N760 Acute vaginitis: Secondary | ICD-10-CM | POA: Insufficient documentation

## 2023-10-05 DIAGNOSIS — E876 Hypokalemia: Secondary | ICD-10-CM | POA: Insufficient documentation

## 2023-10-05 DIAGNOSIS — R1030 Lower abdominal pain, unspecified: Secondary | ICD-10-CM | POA: Diagnosis present

## 2023-10-05 DIAGNOSIS — D649 Anemia, unspecified: Secondary | ICD-10-CM | POA: Diagnosis not present

## 2023-10-05 DIAGNOSIS — R102 Pelvic and perineal pain: Secondary | ICD-10-CM

## 2023-10-05 DIAGNOSIS — B9689 Other specified bacterial agents as the cause of diseases classified elsewhere: Secondary | ICD-10-CM | POA: Insufficient documentation

## 2023-10-05 NOTE — ED Triage Notes (Signed)
 Pt c/o lower abd pain going x 2 days worsening tonight. Associated with urinary frequency, diarrhea, and creamy yellow white vaginal discharge.

## 2023-10-06 LAB — URINALYSIS, ROUTINE W REFLEX MICROSCOPIC
Bilirubin Urine: NEGATIVE
Glucose, UA: NEGATIVE mg/dL
Hgb urine dipstick: NEGATIVE
Ketones, ur: NEGATIVE mg/dL
Leukocytes,Ua: NEGATIVE
Nitrite: NEGATIVE
Protein, ur: NEGATIVE mg/dL
Specific Gravity, Urine: 1.02 (ref 1.005–1.030)
pH: 6 (ref 5.0–8.0)

## 2023-10-06 LAB — CBC
HCT: 33.9 % — ABNORMAL LOW (ref 36.0–46.0)
Hemoglobin: 10.6 g/dL — ABNORMAL LOW (ref 12.0–15.0)
MCH: 27.5 pg (ref 26.0–34.0)
MCHC: 31.3 g/dL (ref 30.0–36.0)
MCV: 87.8 fL (ref 80.0–100.0)
Platelets: 270 K/uL (ref 150–400)
RBC: 3.86 MIL/uL — ABNORMAL LOW (ref 3.87–5.11)
RDW: 14.6 % (ref 11.5–15.5)
WBC: 10 K/uL (ref 4.0–10.5)
nRBC: 0 % (ref 0.0–0.2)

## 2023-10-06 LAB — COMPREHENSIVE METABOLIC PANEL WITH GFR
ALT: 16 U/L (ref 0–44)
AST: 25 U/L (ref 15–41)
Albumin: 4.3 g/dL (ref 3.5–5.0)
Alkaline Phosphatase: 55 U/L (ref 38–126)
Anion gap: 14 (ref 5–15)
BUN: 13 mg/dL (ref 6–20)
CO2: 21 mmol/L — ABNORMAL LOW (ref 22–32)
Calcium: 9.1 mg/dL (ref 8.9–10.3)
Chloride: 102 mmol/L (ref 98–111)
Creatinine, Ser: 0.59 mg/dL (ref 0.44–1.00)
GFR, Estimated: >60 mL/min (ref >60)
Glucose, Bld: 87 mg/dL (ref 70–99)
Potassium: 3.1 mmol/L — ABNORMAL LOW (ref 3.5–5.1)
Sodium: 138 mmol/L (ref 135–145)
Total Bilirubin: 0.5 mg/dL (ref 0.0–1.2)
Total Protein: 7.4 g/dL (ref 6.5–8.1)

## 2023-10-06 LAB — WET PREP, GENITAL
Sperm: NONE SEEN
Trich, Wet Prep: NONE SEEN
WBC, Wet Prep HPF POC: >=10 — AB (ref <10)
Yeast Wet Prep HPF POC: NONE SEEN

## 2023-10-06 LAB — GC/CHLAMYDIA PROBE AMP (~~LOC~~) NOT AT ARMC
Chlamydia: NEGATIVE
Comment: NEGATIVE
Comment: NORMAL
Neisseria Gonorrhea: NEGATIVE

## 2023-10-06 LAB — HCG, SERUM, QUALITATIVE: Preg, Serum: NEGATIVE

## 2023-10-06 MED ORDER — POTASSIUM CHLORIDE CRYS ER 20 MEQ PO TBCR
40.0000 meq | EXTENDED_RELEASE_TABLET | Freq: Once | ORAL | Status: AC
  Administered 2023-10-06: 40 meq via ORAL
  Filled 2023-10-06: qty 2

## 2023-10-06 MED ORDER — CEFTRIAXONE SODIUM 1 G IJ SOLR
500.0000 mg | Freq: Once | INTRAMUSCULAR | Status: AC
  Administered 2023-10-06: 500 mg via INTRAMUSCULAR
  Filled 2023-10-06: qty 10

## 2023-10-06 MED ORDER — LIDOCAINE HCL (PF) 1 % IJ SOLN: 2.0000 mL | Freq: Once | INTRAMUSCULAR | Status: DC

## 2023-10-06 MED ORDER — DOXYCYCLINE HYCLATE 100 MG PO CAPS: 100.0000 mg | ORAL_CAPSULE | Freq: Two times a day (BID) | ORAL | 0 refills | Status: AC

## 2023-10-06 MED ORDER — LIDOCAINE HCL 1 % IJ SOLN
INTRAMUSCULAR | Status: AC
  Filled 2023-10-06: qty 20

## 2023-10-06 MED ORDER — METRONIDAZOLE 500 MG PO TABS: 500.0000 mg | ORAL_TABLET | Freq: Two times a day (BID) | ORAL | 0 refills | Status: AC

## 2023-10-06 NOTE — ED Provider Notes (Signed)
 [facility] Provider Note   CSN: [id number] Arrival date & time: 10/05/23  2312     Patient presents with: Abdominal Pain   Tamara Cummings is a 23 y.o. female.   23 yo female with complaint of lower abdominal pain onset 2 days ago, intermittent, worse with certain movement. Associated with urinary frequency with loose stools and creamy vaginal dc. Reports new sexual partner. Denies fevers, chills, vomiting.       Prior to Admission medications   Medication Sig Start Date End Date Taking? Authorizing Provider  doxycycline  (VIBRAMYCIN ) 100 MG capsule Take 1 capsule (100 mg total) by mouth 2 (two) times daily for 10 days. 10/06/23 10/16/23 Yes Beverley Leita LABOR, PA-C  metroNIDAZOLE  (FLAGYL ) 500 MG tablet Take 1 tablet (500 mg total) by mouth 2 (two) times daily. 10/06/23  Yes Beverley Leita LABOR, PA-C  ABILIFY 5 MG tablet Take 5 mg by mouth at bedtime. 04/04/18   [provider]  albuterol  (PROVENTIL  HFA;VENTOLIN  HFA) 108 (90 Base) MCG/ACT inhaler Inhale 2 puffs into the lungs every 4 (four) hours as needed for wheezing or shortness of breath. 10/28/15   Eilleen Colander, NP  EPINEPHrine  0.3 mg/0.3 mL IJ SOAJ injection Inject 0.3 mLs (0.3 mg total) into the muscle once. Patient taking differently: Inject 0.3 mg into the muscle once. peanuts 01/28/15   Burroughs, Zachary Taylor, MD  escitalopram  (LEXAPRO ) 10 MG tablet Take 1 tablet (10 mg total) by mouth daily. Patient taking differently: Take 10 mg by mouth at bedtime.  03/12/18   Reichert, Bernardino PARAS, MD  hydrOXYzine  (ATARAX /VISTARIL ) 25 MG tablet Take 1 tablet (25 mg total) by mouth at bedtime as needed and may repeat dose one time if needed (insomnia). Patient taking differently: Take 25 mg by mouth at bedtime.  03/12/18   Donzetta Bernardino PARAS, MD    Allergies: Peanut-containing drug products    Review of Systems Negative except as per HPI Updated Vital Signs BP 116/64 (BP Location: Right Arm)    Pulse 72   Temp 98.5 F (36.9 C)   Resp 16   Ht 5' 2 (1.575 m)   Wt 59 kg   LMP 09/20/2023   SpO2 97%   BMI 23.78 kg/m   Physical Exam Vitals and nursing note reviewed. Exam conducted with a chaperone present.  Constitutional:      General: She is not in acute distress.    Appearance: She is well-developed. She is not diaphoretic.  HENT:     Head: Normocephalic and atraumatic.  Pulmonary:     Effort: Pulmonary effort is normal.  Abdominal:     Palpations: Abdomen is soft.     Tenderness: There is no abdominal tenderness.  Genitourinary:    General: Normal vulva.     Vagina: No foreign body. Vaginal discharge present.     Cervix: No cervical motion tenderness, friability or erythema.     Uterus: Normal. Not enlarged and not tender.      Adnexa: Right adnexa normal and left adnexa normal.     Comments: Moderate amount of white discharge present Skin:    General: Skin is warm and dry.     Findings: No erythema or rash.  Neurological:     Mental Status: She is alert and oriented to person, place, and time.  Psychiatric:        Behavior: Behavior normal.     (all labs ordered are listed, but only abnormal results are displayed) Labs Reviewed  WET PREP, GENITAL - Abnormal; Notable for the following components:      Result Value   Clue Cells Wet Prep HPF POC PRESENT (*)    WBC, Wet Prep HPF POC >=10 (*)    All other components within normal limits  COMPREHENSIVE METABOLIC PANEL WITH GFR - Abnormal; Notable for the following components:   Potassium 3.1 (*)    CO2 21 (*)    All other components within normal limits  CBC - Abnormal; Notable for the following components:   RBC 3.86 (*)    Hemoglobin 10.6 (*)    HCT 33.9 (*)    All other components within normal limits  URINALYSIS, ROUTINE W REFLEX MICROSCOPIC - Abnormal; Notable for the following components:   APPearance CLOUDY (*)    All other components within normal limits  HCG, SERUM, QUALITATIVE  GC/CHLAMYDIA  PROBE AMP ([facility]) NOT AT [facility]    EKG: None  Radiology: No results found.   Procedures   Medications Ordered in the ED  lidocaine  (PF) (XYLOCAINE ) 1 % injection 2 mL (has no administration in time range)  lidocaine  (XYLOCAINE ) 1 % (with pres) injection (has no administration in time range)  potassium chloride  SA (KLOR-CON  M) CR tablet 40 mEq (40 mEq Oral Given 10/06/23 0332)  cefTRIAXone  (ROCEPHIN ) injection 500 mg (500 mg Intramuscular Given 10/06/23 0509)                                    Medical Decision Making Amount and/or Complexity of Data Reviewed Labs: ordered.  Risk Prescription drug management.   This patient presents to the ED for concern of lower abdominal pain, this involves an extensive number of treatment options, and is a complaint that carries with it a high risk of complications and morbidity.  The differential diagnosis includes PID, TOA, ectopic, STI, BV, yeast, trich   Co morbidities / Chronic conditions that complicate the patient evaluation  MDD   Additional history obtained:  Additional history obtained from EMR External records from outside source obtained and reviewed including prior labs from 5 years ago on file   Lab Tests:  I Ordered, and personally interpreted labs.  The pertinent results include:  CBC with mild anemia, improved from prior on file. CMP with K 3.1, mild. Hcg negative. UA normal. Wet prep + clue cells and WBC. Gc/chlamydia sent out.     Problem List / ED Course / Critical interventions / Medication management  23 yo female sexually active with new partner, concern for pelvic pain with dc. No sig abdominal pain on exam, no pelvic tenderness. Does have white dc present, clue cells and WBC on wet prep. Will cover with rocephin  and doxy, flagyl  for BV. Recheck with PCP.  I ordered medication including rocephin , kdur   Reevaluation of the patient after these medicines showed that the patient stable I have reviewed the  patients home medicines and have made adjustments as needed    Social Determinants of Health:  PCP on file   Test / Admission - Considered:  Consider pelvic us  but no CMT on exam, no adnexal tenderness, afebrile, normal WBC do not suspect TOA/PID      Final diagnoses:  Pelvic pain in female  Bacterial vaginosis    ED Discharge Orders          Ordered    metroNIDAZOLE  (FLAGYL ) 500 MG tablet  2 times daily  10/06/23 0500    doxycycline  (VIBRAMYCIN ) 100 MG capsule  2 times daily        10/06/23 0500               Beverley Leita LABOR, PA-C 10/06/23 0520    Palumbo, April, MD 10/06/23 MANUS

## 2023-10-06 NOTE — Discharge Instructions (Signed)
 Take antibiotics as prescribed and complete the full course. Recheck with your primary care provider in 2 days. Return to the ER for fever, worsening pain or other concerns. Do not have sex until you know the remaining test results.  If you are positive for gonorrhea or chlamydia, your partner will also need to be treated.
